# Patient Record
Sex: Male | Born: 2000 | Race: Black or African American | Hispanic: No | Marital: Single | Smoking: Never smoker
Health system: Southern US, Community
[De-identification: ages and names within clinical notes are randomized; demographics above are authoritative.]

## PROBLEM LIST (undated history)

## (undated) DIAGNOSIS — F840 Autistic disorder: Secondary | ICD-10-CM

## (undated) HISTORY — DX: Autistic disorder: F84.0

---
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
## 2024-02-12 ENCOUNTER — Telehealth: Payer: Self-pay | Admitting: Pediatrics

## 2024-02-12 NOTE — Telephone Encounter (Signed)
 A medical records request was received from [facility] and forwarded to the HIM Department - ROI Team.
